# Patient Record
Sex: Male | Born: 1975 | Race: Asian | Hispanic: No | Marital: Married | Smoking: Former smoker
Health system: Southern US, Community
[De-identification: ages and names within clinical notes are randomized; demographics above are authoritative.]

## PROBLEM LIST (undated history)

## (undated) DIAGNOSIS — R55 Syncope and collapse: Secondary | ICD-10-CM

## (undated) DIAGNOSIS — Z720 Tobacco use: Secondary | ICD-10-CM

## (undated) DIAGNOSIS — L0100 Impetigo, unspecified: Secondary | ICD-10-CM

## (undated) DIAGNOSIS — D150 Benign neoplasm of thymus: Secondary | ICD-10-CM

## (undated) DIAGNOSIS — D4989 Neoplasm of unspecified behavior of other specified sites: Secondary | ICD-10-CM

## (undated) HISTORY — DX: Neoplasm of unspecified behavior of other specified sites: D49.89

## (undated) HISTORY — PX: ABDOMINAL SURGERY: SHX537

## (undated) HISTORY — DX: Benign neoplasm of thymus: D15.0

## (undated) HISTORY — DX: Impetigo, unspecified: L01.00

## (undated) HISTORY — DX: Tobacco use: Z72.0

## (undated) HISTORY — DX: Syncope and collapse: R55

---

## 2013-12-16 ENCOUNTER — Emergency Department (HOSPITAL_COMMUNITY)
Admission: EM | Admit: 2013-12-16 | Discharge: 2013-12-16 | Disposition: A | Discharge status: Home / Self Care | Payer: BC Managed Care – PPO | Attending: Emergency Medicine | Admitting: Emergency Medicine

## 2013-12-16 ENCOUNTER — Encounter (HOSPITAL_COMMUNITY): Payer: Self-pay | Admitting: Emergency Medicine

## 2013-12-16 DIAGNOSIS — R21 Rash and other nonspecific skin eruption: Secondary | ICD-10-CM | POA: Insufficient documentation

## 2013-12-16 DIAGNOSIS — Z8619 Personal history of other infectious and parasitic diseases: Secondary | ICD-10-CM | POA: Insufficient documentation

## 2013-12-16 DIAGNOSIS — F17200 Nicotine dependence, unspecified, uncomplicated: Secondary | ICD-10-CM | POA: Insufficient documentation

## 2013-12-16 MED ORDER — PREDNISONE 20 MG PO TABS: 40.0000 mg | ORAL_TABLET | Freq: Every day | ORAL | Status: DC

## 2013-12-16 MED ORDER — PREDNISONE 20 MG PO TABS
40.0000 mg | ORAL_TABLET | Freq: Once | ORAL | Status: AC
  Administered 2013-12-16: 40 mg via ORAL
  Filled 2013-12-16: qty 2

## 2013-12-16 NOTE — ED Notes (Signed)
Pt seen at Urgent care for infection of foot. Returned for re evaluation. Cellulitis- Recommendation for IV antibiotic

## 2013-12-16 NOTE — ED Provider Notes (Signed)
Medical screening examination/treatment/procedure(s) were conducted as a shared visit with non-physician practitioner(s) and myself.  I personally evaluated the patient during the encounter.  Pt with rash on right leg, dry, scaly, draining, present for a few months.  Has been growing bigger, now spread to arms, legs, torso, face, but sparing palms, soles and scalp.  Mildly pruritic, not painful. No fevers, no one in household with similar, no new soaps, detergents.  Pt has been on oral abx without any improvement.  No airway problems.  No oral lesions.  No diarrhea.   Will start on oral prednisone and refer to dermatology.  No fever.    Saddie Benders. Dorna Mai, MD 12/16/13 3612

## 2013-12-16 NOTE — Discharge Instructions (Signed)
Call [facility] [phone].[facility] [phone], or another dermatologist for further evaluation of your rash.  Call for a follow up appointment with a Family or Primary Care Provider.  Return if Symptoms worsen.   Take medication as prescribed.

## 2013-12-16 NOTE — ED Provider Notes (Signed)
CSN: [id number]     Arrival date & time 12/16/13  1605 History   First MD Initiated Contact with Patient 12/16/13 1642     Chief Complaint  Patient presents with  . Cellulitis     (Consider location/radiation/quality/duration/timing/severity/associated sxs/prior Treatment) HPI Comments: Robert Payne is a 38 y.o. Male, presenting to the Emergency Department with a chief complaint of rash.  The patient reports an approximately 4 week history of rash to left lower extremity.  He reports spread to the rest of his body for the past few weeks.  He reports a clear fluid weeping from the lower leg lesion 2 weeks ago.  He describes the discomfort as itching.  He denies fever or chills, myalgias. He denies prodrome of tingling, pain prior to the rash appearing.  He reports having chicken pox previously.  He denies new pets, outdoor exposure, lotions or detergents, recent travel, anyone in the household with similar rash.  He reports being evaluated by Urgent care on 12/11/2013 and was prescribed Cephalexin 500 mg TID and hydroxyzine on 12/11/2013.   The history is provided by the patient. No language interpreter was used.    History reviewed. No pertinent past medical history. History reviewed. No pertinent past surgical history. History reviewed. No pertinent family history. History  Substance Use Topics  . Smoking status: Light Tobacco Smoker  . Smokeless tobacco: Never Used  . Alcohol Use: No    Review of Systems  Constitutional: Negative for fever and chills.  Skin: Positive for rash and wound.  Neurological: Negative for numbness.      Allergies  Review of patient's allergies indicates no known allergies.  Home Medications  No current outpatient prescriptions on file. BP 107/72  Pulse 91  Temp(Src) 98.9 F (37.2 C) (Oral)  Resp 20  SpO2 97% Physical Exam  Nursing note and vitals reviewed. Constitutional: He is oriented to person, place, and time. He appears well-developed and  well-nourished. No distress.  HENT:  Head: Normocephalic and atraumatic.  Neck: Neck supple.  Pulmonary/Chest: Effort normal. No respiratory distress.  Musculoskeletal: Normal range of motion. He exhibits no edema.       Legs: Large errythmic lesion approximately 15 x 10 cm to right lower extremity, non circumferential.  Scant serosanguineous dried discharge.  Multiple ulcerative vesicles in groups to bilateral lower extremities, upper extremities, trunk and face.  Nothing on the palms or webs of fingers.  Neurological: He is oriented to person, place, and time.  Skin: Skin is warm and dry. Rash noted. He is not diaphoretic.  Psychiatric: He has a normal mood and affect. His behavior is normal.    ED Course  Procedures (including critical care time) Labs Review Labs Reviewed - No data to display Imaging Review No results found.   EKG Interpretation None      MDM   Final diagnoses:  Rash   Pt with pruritic rash for several months, sent by UC. The left lower leg lesion does not appear to be cellulitic. Questionable contact dermatitis.  Wound culture shows No growth in 48 hours. Dr. Dorna Mai also evaluated the patient and agrees no signs of infection at this time.  Will refer to dermatology for further evaluation, and short course of prednisone for inflammation.  Discussed and treatment plan with the patient. Return precautions given. Reports understanding and no other concerns at this time.  Patient is stable for discharge at this time.  Meds given in ED:  Medications  predniSONE (DELTASONE) tablet 40 mg (40 mg  Oral Given 12/16/13 1836)    Discharge Medication List as of 12/16/2013  6:15 PM        Ander Purpura Burnetta Sabin, PA-C 12/18/13 1549

## 2013-12-16 NOTE — ED Notes (Signed)
Pt sts he went to [facility] and was treated for cellulitis on 4/2 and given Rx for hydroxyzine 25mg  and clindamycin 300mg . Pt sts his swelling and reddness on his R lower leg has not improved and is painful. Pt has area outlined from urgent care and infection area has not spread. Pt is concerned his treatment is not working and was referred to ED by [facility] urgent care. Pt does have results from wound culture which are negative.

## 2014-01-15 ENCOUNTER — Emergency Department (HOSPITAL_COMMUNITY)
Admission: EM | Admit: 2014-01-15 | Discharge: 2014-01-16 | Disposition: A | Discharge status: Other Acute Inpatient Hospital | Payer: BC Managed Care – PPO | Attending: Emergency Medicine | Admitting: Emergency Medicine

## 2014-01-15 ENCOUNTER — Emergency Department (HOSPITAL_COMMUNITY): Payer: BC Managed Care – PPO

## 2014-01-15 ENCOUNTER — Encounter (HOSPITAL_COMMUNITY): Payer: Self-pay | Admitting: Emergency Medicine

## 2014-01-15 DIAGNOSIS — R509 Fever, unspecified: Secondary | ICD-10-CM | POA: Insufficient documentation

## 2014-01-15 DIAGNOSIS — R000 Tachycardia, unspecified: Secondary | ICD-10-CM | POA: Insufficient documentation

## 2014-01-15 DIAGNOSIS — R222 Localized swelling, mass and lump, trunk: Secondary | ICD-10-CM | POA: Insufficient documentation

## 2014-01-15 DIAGNOSIS — R21 Rash and other nonspecific skin eruption: Secondary | ICD-10-CM | POA: Insufficient documentation

## 2014-01-15 DIAGNOSIS — J9859 Other diseases of mediastinum, not elsewhere classified: Secondary | ICD-10-CM

## 2014-01-15 DIAGNOSIS — F17200 Nicotine dependence, unspecified, uncomplicated: Secondary | ICD-10-CM | POA: Insufficient documentation

## 2014-01-15 LAB — URINALYSIS, ROUTINE W REFLEX MICROSCOPIC
Bilirubin Urine: NEGATIVE
Glucose, UA: NEGATIVE mg/dL
Hgb urine dipstick: NEGATIVE
Ketones, ur: NEGATIVE mg/dL
LEUKOCYTES UA: NEGATIVE
Nitrite: NEGATIVE
Protein, ur: NEGATIVE mg/dL
Specific Gravity, Urine: 1.022 (ref 1.005–1.030)
UROBILINOGEN UA: 0.2 mg/dL (ref 0.0–1.0)
pH: 6 (ref 5.0–8.0)

## 2014-01-15 LAB — CBC WITH DIFFERENTIAL/PLATELET
BASOS ABS: 0 10*3/uL (ref 0.0–0.1)
Basophils Relative: 0 % (ref 0–1)
EOS ABS: 0.1 10*3/uL (ref 0.0–0.7)
EOS PCT: 1 % (ref 0–5)
HEMATOCRIT: 46.4 % (ref 39.0–52.0)
Hemoglobin: 15.2 g/dL (ref 13.0–17.0)
LYMPHS PCT: 8 % — AB (ref 12–46)
Lymphs Abs: 0.7 10*3/uL (ref 0.7–4.0)
MCH: 30.2 pg (ref 26.0–34.0)
MCHC: 32.8 g/dL (ref 30.0–36.0)
MCV: 92.1 fL (ref 78.0–100.0)
Monocytes Absolute: 0.4 10*3/uL (ref 0.1–1.0)
Monocytes Relative: 4 % (ref 3–12)
Neutro Abs: 7.6 10*3/uL (ref 1.7–7.7)
Neutrophils Relative %: 88 % — ABNORMAL HIGH (ref 43–77)
Platelets: 197 10*3/uL (ref 150–400)
RBC: 5.04 MIL/uL (ref 4.22–5.81)
RDW: 12.2 % (ref 11.5–15.5)
WBC: 8.6 10*3/uL (ref 4.0–10.5)

## 2014-01-15 LAB — COMPREHENSIVE METABOLIC PANEL
ALT: 61 U/L — ABNORMAL HIGH (ref 0–53)
AST: 52 U/L — ABNORMAL HIGH (ref 0–37)
Albumin: 3.8 g/dL (ref 3.5–5.2)
Alkaline Phosphatase: 57 U/L (ref 39–117)
BUN: 10 mg/dL (ref 6–23)
CO2: 27 meq/L (ref 19–32)
CREATININE: 1.13 mg/dL (ref 0.50–1.35)
Calcium: 8.9 mg/dL (ref 8.4–10.5)
Chloride: 95 mEq/L — ABNORMAL LOW (ref 96–112)
GFR, EST NON AFRICAN AMERICAN: 82 mL/min — AB (ref >90)
Glucose, Bld: 116 mg/dL — ABNORMAL HIGH (ref 70–99)
Potassium: 3.8 mEq/L (ref 3.7–5.3)
Sodium: 136 mEq/L — ABNORMAL LOW (ref 137–147)
Total Bilirubin: 0.2 mg/dL — ABNORMAL LOW (ref 0.3–1.2)
Total Protein: 7.2 g/dL (ref 6.0–8.3)

## 2014-01-15 LAB — RAPID STREP SCREEN (MED CTR MEBANE ONLY): STREPTOCOCCUS, GROUP A SCREEN (DIRECT): NEGATIVE

## 2014-01-15 LAB — MONONUCLEOSIS SCREEN: Mono Screen: NEGATIVE

## 2014-01-15 MED ORDER — IOHEXOL 300 MG/ML  SOLN
100.0000 mL | Freq: Once | INTRAMUSCULAR | Status: AC | PRN
  Administered 2014-01-15: 100 mL via INTRAVENOUS

## 2014-01-15 MED ORDER — ACETAMINOPHEN 325 MG PO TABS
650.0000 mg | ORAL_TABLET | Freq: Four times a day (QID) | ORAL | Status: DC | PRN
Start: 2014-01-15 — End: 2014-01-16
  Administered 2014-01-15 (×2): 650 mg via ORAL
  Filled 2014-01-15 (×2): qty 2

## 2014-01-15 NOTE — ED Notes (Addendum)
Pt c/o fever x 2 days, states it started to spike today. Pt has been taking paracetamol for his fever. Pt also c/o slight headache, pt also had flushed red face.

## 2014-01-15 NOTE — ED Notes (Addendum)
Pt A&Ox4. Reports fevers "around 100" starting two days ago. Has been taking "medicine from [place] for fever called panadol." Patient has red raised rash on back, abdomen, and chest. Was seen a month ago and was diagnosed with skin infection but doesn't remember what he was diagnosed with. Was treated with prednisone and bactrim. However, raised rash started this morning. Noticed facial flushing this morning and felt "hot" with a headache. Drinking lots of water. Denies N, V, D, chest pain but does have extreme chills.

## 2014-01-15 NOTE — ED Provider Notes (Signed)
CSN: [id number]     Arrival date & time 01/15/14  1714 History   First MD Initiated Contact with Patient 01/15/14 2015     Chief Complaint  Patient presents with  . Fever     (Consider location/radiation/quality/duration/timing/severity/associated sxs/prior Treatment) The history is provided by the patient and medical records. No language interpreter was used.    Robert Payne is a 38 y.o. male  with no known medical history presents to the Emergency Department complaining of gradual, persistent, progressively worsening fever onset 36 hours ago.  Patient reports yesterday he had a low-grade fever and he began taking paracetamol for this.  He reports that today his fever spiked to 105 measured with an infrared thermometer.  He reports taking Tylenol prior to arrival. He also reports that after his fever became high he developed a rash over his entire body including his face.  He reports that approximately one month ago he was seen in the emergency Department for rash on his right lower leg and subsequently followed up with dermatology. He is unsure of the diagnosis however he was given Bactrim, prednisone and topical cream with some relief.  He reports the rash that he has on his body today is completely different from previous. He denies new foods, lotions, environments, recent travel or known sick contacts.  He reports he has been in the [place] for approximately 4 years.  He has had no overseas visitors. No tick bites.  Vaccines UTD.    History reviewed. No pertinent past medical history. History reviewed. No pertinent past surgical history. No family history on file. History  Substance Use Topics  . Smoking status: Light Tobacco Smoker  . Smokeless tobacco: Never Used  . Alcohol Use: No    Review of Systems  Constitutional: Positive for fever. Negative for diaphoresis, appetite change, fatigue and unexpected weight change.  HENT: Negative for mouth sores.   Eyes: Negative for  visual disturbance.  Respiratory: Negative for cough, chest tightness, shortness of breath and wheezing.   Cardiovascular: Negative for chest pain.  Gastrointestinal: Negative for nausea, vomiting, abdominal pain, diarrhea and constipation.  Endocrine: Negative for polydipsia, polyphagia and polyuria.  Genitourinary: Negative for dysuria, urgency, frequency and hematuria.  Musculoskeletal: Negative for back pain and neck stiffness.  Skin: Positive for rash.  Allergic/Immunologic: Negative for immunocompromised state.  Neurological: Negative for syncope, light-headedness and headaches.  Hematological: Does not bruise/bleed easily.  Psychiatric/Behavioral: Negative for sleep disturbance. The patient is not nervous/anxious.       Allergies  Review of patient's allergies indicates no known allergies.  Home Medications   Prior to Admission medications   Medication Sig Start Date End Date Taking? Authorizing Provider  PRESCRIPTION MEDICATION Take 500 mg by mouth as needed (fever). Paracetamol 500mg    Yes Historical Provider, MD   BP 101/64  Pulse 87  Temp(Src) 99 F (37.2 C) (Oral)  Resp 16  SpO2 96% Physical Exam  Nursing note and vitals reviewed. Constitutional: He is oriented to person, place, and time. He appears well-developed and well-nourished. No distress.  Awake, alert, nontoxic appearance  HENT:  Head: Normocephalic and atraumatic.  Right Ear: Tympanic membrane, external ear and ear canal normal.  Left Ear: Tympanic membrane, external ear and ear canal normal.  Nose: Nose normal. No epistaxis. Right sinus exhibits no maxillary sinus tenderness and no frontal sinus tenderness. Left sinus exhibits no maxillary sinus tenderness and no frontal sinus tenderness.  Mouth/Throat: Uvula is midline, oropharynx is clear and moist and  mucous membranes are normal. Mucous membranes are not pale and not cyanotic. No oropharyngeal exudate, posterior oropharyngeal edema, posterior  oropharyngeal erythema or tonsillar abscesses.  Erythematous oropharynx without tonsillar swelling, exudate or edema No oral lesions, purpura or petechiae of the palate  Eyes: Conjunctivae are normal. Pupils are equal, round, and reactive to light. No scleral icterus.  Neck: Normal range of motion and full passive range of motion without pain. Neck supple. No spinous process tenderness and no muscular tenderness present. No rigidity. Normal range of motion present. No Brudzinski's sign and no Kernig's sign noted.  Full range of motion without pain No midline or paraspinal tenderness No nuchal rigidity or meningeal signs  Cardiovascular: Regular rhythm, normal heart sounds and intact distal pulses.  Tachycardia present.   Pulses:      Radial pulses are 2+ on the right side, and 2+ on the left side.       Dorsalis pedis pulses are 2+ on the right side, and 2+ on the left side.       Posterior tibial pulses are 2+ on the right side, and 2+ on the left side.  Mild tachycardia  Pulmonary/Chest: Effort normal and breath sounds normal. No stridor. No respiratory distress. He has no wheezes.  Clear and equal breath sounds  Abdominal: Soft. Bowel sounds are normal. He exhibits no mass. There is no tenderness. There is no rebound and no guarding. Hernia confirmed negative in the right inguinal area and confirmed negative in the left inguinal area.  Abdomen soft and nontender  Genitourinary: Testes normal and penis normal. Right testis shows no mass and no tenderness. Left testis shows no mass and no tenderness. No penile erythema.  NO rash to the genitals  Musculoskeletal: Normal range of motion. He exhibits no edema.  Lymphadenopathy:    He has no cervical adenopathy.       Right: No inguinal adenopathy present.       Left: No inguinal adenopathy present.  Neurological: He is alert and oriented to person, place, and time. He exhibits normal muscle tone. Coordination normal.  Speech is clear and goal  oriented Moves extremities without ataxia  Skin: Skin is warm and dry. Rash noted. He is not diaphoretic. There is erythema.  Macular rash dispersed over the entire body and face, blanching Papular rash, vesicular and excoriated noted over the legs and hands consistent with healing MRSA Red, scaly patch noted to the right anterior leg without evidence of secondary infection No purpura or petechiae  Psychiatric: He has a normal mood and affect.    ED Course  Procedures (including critical care time) Labs Review Labs Reviewed  CBC WITH DIFFERENTIAL - Abnormal; Notable for the following:    Neutrophils Relative % 88 (*)    Lymphocytes Relative 8 (*)    All other components within normal limits  COMPREHENSIVE METABOLIC PANEL - Abnormal; Notable for the following:    Sodium 136 (*)    Chloride 95 (*)    Glucose, Bld 116 (*)    AST 52 (*)    ALT 61 (*)    Total Bilirubin 0.2 (*)    GFR calc non Af Amer 82 (*)    All other components within normal limits  RAPID STREP SCREEN  CULTURE, GROUP A STREP  URINALYSIS, ROUTINE W REFLEX MICROSCOPIC  MONONUCLEOSIS SCREEN  ROCKY MTN SPOTTED FVR AB, IGG-BLOOD  ROCKY MTN SPOTTED FVR AB, IGM-BLOOD  HIV ANTIBODY (ROUTINE TESTING)    Imaging Review Dg Chest 2 View  01/15/2014   CLINICAL DATA:  Fever, rash, smoker.  EXAM: CHEST  2 VIEW  COMPARISON:  None.  FINDINGS: No evidence of pneumonia, edema, effusion, or pneumothorax. There is a mediastinal mass, likely anterior, measuring nearly 7 cm. This could represent adenopathy, thymic tumor or germ cell tumor. Normal heart size.  IMPRESSION: 1. 7 cm anterior mediastinal mass, as above. Recommend enhanced chest CT followup. 2. Negative for pneumonia or edema.   Electronically Signed   By: Jorje Guild M.D.   On: 01/15/2014 21:28   Ct Chest W Contrast  01/15/2014   CLINICAL DATA:  Mediastinal mass  EXAM: CT CHEST WITH CONTRAST  TECHNIQUE: Multidetector CT imaging of the chest was performed during  intravenous contrast administration.  CONTRAST:  158mL OMNIPAQUE IOHEXOL 300 MG/ML  SOLN  COMPARISON:  DG CHEST 2 VIEW dated 01/15/2014  FINDINGS: The central airways are patent. The lungs are clear. There is no pleural effusion or pneumothorax.  There are no pathologically enlarged axillary, hilar or mediastinal lymph nodes. There is a right anterior mediastinal mass measuring 3.4 x 6.1 cm with both a cystic and solid component. The solid component measures 3.2 x 3 cm.  The heart size is normal. There is no pericardial effusion. The thoracic aorta is normal in caliber.  Review of bone windows demonstrates no focal lytic or sclerotic lesions.  Limited non-contrast images of the upper abdomen were obtained. The adrenal glands appear normal. The remainder of the visualized abdominal organs are unremarkable.  IMPRESSION: 1. No acute disease of the chest. 2. 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 Differential considerations include thymoma, thymic carcinoma, germ cell neoplasm versus lymphoma.   Electronically Signed   By: Kathreen Devoid   On: 01/15/2014 23:41     EKG Interpretation None                MDM   Final diagnoses:  Mediastinal mass  Fever  Rash   WALDEMAR SIEGEL presents with fever and rash.  Patient without recent travel. Denies new contacts. Patient without nuchal rigidity, digital signs, purpura petechiae. Highly doubt meningitis. Will assess for possibility of other infections including strep and mononucleosis.  Also SJS is of concern as pt has been taking Bactrim.  Exam without oral lesions,   10:16 PM Chest and mononucleosis negative. Urinalysis without evidence of urinary tract infection, CBC unremarkable. CMP with mildly elevated transaminase but otherwise reassuring. Chest x-ray with a 7 cm anterior mediastinal mass but no evidence of pneumonia, pulmonary edema or pneumothorax. No evidence of tuberculosis on chest x-ray. Will obtain CT  chest is recommended for followup of mediastinal mass.  12:10 AM CT with Complex right anterior mediastinal mass with both cystic and solid components. Differential considerations include thymoma, thymic carcinoma, germ cell neoplasm versus lymphoma.  I personally reviewed the imaging tests through PACS system.  I reviewed available ER/hospitalization records through the EMR.    Concerns for possible early SJS and pt with fevers and large mediastinal mass.  He is going to need admission for observation of rash and work-up of mass.  Pt rash is only a bit improved.  His fever has resolved at last check.     12:49 AM Discussed with [facility] who will evaluate.    1:54 AM Medicine admission for fever with diffuse body rash and chest mass at [facility]       Jarrett Soho Camarie Mctigue, PA-C 01/16/14 [phone]

## 2014-01-15 NOTE — ED Notes (Signed)
The need for urine is noted, the Pt does not have to urinate at this time. A urinal was left at the bedside with instructions to use the call bell to notify staff when he has provided a sample.

## 2014-01-15 NOTE — ED Provider Notes (Signed)
Medical screening examination/treatment/procedure(s) were conducted as a shared visit with non-physician practitioner(s) and myself.  I personally evaluated the patient during the encounter.   EKG Interpretation None     Patient with rash and fever x2 days. Rash is erythematous and macular and is on his face and chest. He has been on Bactrim for presumed MRSA infection. 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 We'll have medicine consultation  Leota Jacobsen, MD 01/15/14 2347

## 2014-01-16 LAB — ROCKY MTN SPOTTED FVR AB, IGG-BLOOD: RMSF IgG: 0.13 IV

## 2014-01-16 LAB — HIV ANTIBODY (ROUTINE TESTING W REFLEX): HIV: NONREACTIVE

## 2014-01-16 LAB — ROCKY MTN SPOTTED FVR AB, IGM-BLOOD: RMSF IgM: 0.11 IV (ref 0.00–0.89)

## 2014-01-16 NOTE — ED Notes (Signed)
Bed assignment received from [place] at [facility] at this time.

## 2014-01-16 NOTE — ED Notes (Signed)
[facility] called for transport. 

## 2014-01-16 NOTE — Progress Notes (Signed)
Requested by ED to evaluate patient for rash and incidental finding on chest mass on Ct chest, patient appears to be having 3 different type of rash. maculopapular rash in trunk extending to periphery, and plagues skin rash on extremities, arms and legs,  And diffuse scaly erythema on the face, as well presents with fever, etiology in unclear, patient with need tertiary care center evaluation with dermatology service.

## 2014-01-17 LAB — CULTURE, GROUP A STREP

## 2014-01-17 NOTE — ED Provider Notes (Signed)
Medical screening examination/treatment/procedure(s) were performed by non-physician practitioner and as supervising physician I was immediately available for consultation/collaboration.  Dewight Catino T Cannon Quinton, MD 01/17/14 1523 

## 2014-01-27 HISTORY — PX: TUBE THORACOTOMY: SHX459

## 2014-02-20 ENCOUNTER — Ambulatory Visit (INDEPENDENT_AMBULATORY_CARE_PROVIDER_SITE_OTHER): Payer: BC Managed Care – PPO | Admitting: Family Medicine

## 2014-02-20 ENCOUNTER — Encounter: Payer: Self-pay | Admitting: Family Medicine

## 2014-02-20 VITALS — BP 102/60 | HR 66 | Temp 98.2°F | Ht 66.25 in | Wt 128.0 lb

## 2014-02-20 DIAGNOSIS — R222 Localized swelling, mass and lump, trunk: Secondary | ICD-10-CM

## 2014-02-20 DIAGNOSIS — Z87898 Personal history of other specified conditions: Secondary | ICD-10-CM

## 2014-02-20 DIAGNOSIS — F17200 Nicotine dependence, unspecified, uncomplicated: Secondary | ICD-10-CM

## 2014-02-20 DIAGNOSIS — Z720 Tobacco use: Secondary | ICD-10-CM | POA: Insufficient documentation

## 2014-02-20 DIAGNOSIS — J9859 Other diseases of mediastinum, not elsewhere classified: Secondary | ICD-10-CM

## 2014-02-20 DIAGNOSIS — Z9189 Other specified personal risk factors, not elsewhere classified: Secondary | ICD-10-CM

## 2014-02-20 NOTE — Progress Notes (Signed)
Pre visit review using our clinic review tool, if applicable. No additional management support is needed unless otherwise documented below in the visit note. 

## 2014-02-20 NOTE — Progress Notes (Signed)
No chief complaint on file.   HPI:  Robert Payne is here to establish care. Moved from [place] 4 years ago. Lives with wife and young child.   Last PCP and physical:  Has the following chronic problems and concerns today:  Patient Active Problem List   Diagnosis Date Noted  . Tobacco use 02/20/2014  . 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 Seizure  -reports evaluated in ED and then told may want to see neurologist - wants referral to neurologist -no exercise since but no further episodes -denies: DOE, CP, papitaitons, any LOC since  ROS: See pertinent positives and negatives per HPI.  Past Medical History  Diagnosis Date  . Syncope     in 2013 per pt evaluated in ED and ok    No family history on file.  History   Social History  . Marital Status: Married    Spouse Name: N/A    Number of Children: N/A  . Years of Education: N/A   Social History Main Topics  . Smoking status: Former Research scientist (life sciences)  . Smokeless tobacco: Never Used     Comment: quit 1 month ago as of 02/20/2014 - light smoker in the past for 12 years  . Alcohol Use: No  . Drug Use: No   . Sexual Activity: None   Other Topics Concern  . 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  EXAM:  Filed Vitals:   02/20/14 1431  BP: 102/60  Pulse: 66  Temp: 98.2 F (36.8 C)    Body mass index is 20.5 kg/(m^2).  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 -We provided refills for any medications we will prescribe as needed. -We addressed current concerns per orders and patient instructions. -We have asked for records for pertinent exams, studies, vaccines and notes from previous providers. -We have advised patient to follow up per instructions below. -Follow up as needed and yearly  -Patient advised to return or notify a doctor immediately if symptoms worsen or persist or new concerns arise.  Patient Instructions  -We placed a referral for you as discussed to the neurologist. It usually takes about  1-2 weeks to process and schedule this referral. If you have not heard from [place] regarding this appointment in 2 weeks please contact our office.       Colin Benton R.

## 2014-02-20 NOTE — Patient Instructions (Signed)
-  We placed a referral for you as discussed to the neurologist. It usually takes about 1-2 weeks to process and schedule this referral. If you have not heard from [place] regarding this appointment in 2 weeks please contact our office.

## 2014-03-09 HISTORY — PX: OTHER SURGICAL HISTORY: SHX169

## 2014-04-10 DIAGNOSIS — D4989 Neoplasm of unspecified behavior of other specified sites: Secondary | ICD-10-CM | POA: Insufficient documentation

## 2014-04-10 DIAGNOSIS — D150 Benign neoplasm of thymus: Secondary | ICD-10-CM

## 2014-05-05 ENCOUNTER — Ambulatory Visit (INDEPENDENT_AMBULATORY_CARE_PROVIDER_SITE_OTHER): Payer: BC Managed Care – PPO | Admitting: Neurology

## 2014-05-05 ENCOUNTER — Encounter: Payer: Self-pay | Admitting: Neurology

## 2014-05-05 VITALS — BP 100/70 | HR 86 | Ht 67.0 in | Wt 132.0 lb

## 2014-05-05 DIAGNOSIS — R55 Syncope and collapse: Secondary | ICD-10-CM

## 2014-05-05 NOTE — Progress Notes (Signed)
NEUROLOGY CONSULTATION NOTE  AGUSTUS MANE MRN: [id number] DOB: 09-13-1975  Referring provider: Dr. Colin Benton Primary care provider: Dr. Colin Benton  Reason for consult:  syncope  Dear Dr Maudie Mercury:  Thank you for your kind referral of Robert Payne for consultation of the above symptoms. Although his history is well known to you, please allow me to reiterate it for the purpose of our medical record. Records and images were personally reviewed where available.  HISTORY OF PRESENT ILLNESS: This is a pleasant 38 year old right-handed man with a history of thymoma s/p resection, presenting for episodes of loss of consciousness.  The first episode occurred 8-9 years ago while living in [place]. He was working hard and recalls feeling hungry. He was outside smoking, then suddenly he felt a little lightheaded, heart was beating faster, then everything went dark and he woke up on the ground. The second episode occurred a few years later. He gets motion sickness, and had been feeling nauseated in the bus. He got off the bus and walked a little, then started feeling lightheaded and was witnessed by his wife to pass out for a minute.  No bowel/bladder incontinence, no tongue bite. He has not injured himself with these.  The last episode occurred around 2 years ago while living in [place]. He was out jogging with his wife, and ran faster for 5 minutes. He started feeling short of breath with heart beating faster, then felt lightheaded and passed out. His wife reported he had a little shaking. He woke up and vomited, and was brought to the ER where head CT reported as normal. He was advised to see a neurologist.  Since then, he denies any further similar episodes.  He has not been exercising due to his wife's fear of another similar episode.  He denies any headaches, dizziness, diplopia, dysarthria, dysphagia, neck/back pain, focal numbness/tingling/weakness, bowel/bladder dysfunction. He denies any chest pain. He  denies any olfactory/gustatory hallucinations, deja vu, rising epigastric sensation, myoclonic jerks.  He had a normal birth and early development.  There is no history of febrile convulsions, CNS infections such as meningitis/encephalitis, significant traumatic brain injury, neurosurgical procedures, or family history of seizures.  He reports having an echocardiogram done at [facility] as part of surgical clearance prior to thymoma surgery that was normal.  Thymoma was found incidentally after he had a chest xray for fever due to Bactrim allergy.  PAST MEDICAL HISTORY: Past Medical History  Diagnosis Date  . Syncope     in 2013 per pt evaluated in ED and ok    PAST SURGICAL HISTORY: Past Surgical History  Procedure Laterality Date  . Abdominal surgery      MEDICATIONS: No current outpatient prescriptions on file prior to visit.   No current facility-administered medications on file prior to visit.    ALLERGIES: Allergies  Allergen Reactions  . Sulfamethoxazole-Trimethoprim Rash    FAMILY HISTORY: No family history on file.  SOCIAL HISTORY: History   Social History  . Marital Status: Married    Spouse Name: N/A    Number of Children: N/A  . Years of Education: N/A   Occupational History  . Not on file.   Social History Main Topics  . Smoking status: Former Research scientist (life sciences)  . Smokeless tobacco: Never Used     Comment: quit 1 month ago as of 02/20/2014 - light smoker in the past for 12 years  . Alcohol Use: No  . Drug Use: No  . Sexual Activity:  Not on file   Other Topics Concern  . 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      Spiritual Beliefs: Christian      Lifestyle: no regular exercise; diet is healthy             REVIEW OF SYSTEMS: Constitutional: No fevers, chills, or sweats, no generalized fatigue, change in appetite Eyes: No visual changes, double vision, eye  pain Ear, nose and throat: No hearing loss, ear pain, nasal congestion, sore throat Cardiovascular: No chest pain, palpitations Respiratory:  No shortness of breath at rest or with exertion, wheezes GastrointestinaI: No nausea, vomiting, diarrhea, abdominal pain, fecal incontinence Genitourinary:  No dysuria, urinary retention or frequency Musculoskeletal:  No neck pain, back pain Integumentary: + rash on right leg, no pruritus Neurological: as above Psychiatric: No depression, insomnia, anxiety Endocrine: No palpitations, fatigue, diaphoresis, mood swings, change in appetite, change in weight, increased thirst Hematologic/Lymphatic:  No anemia, purpura, petechiae. Allergic/Immunologic: no itchy/runny eyes, nasal congestion, recent allergic reactions, rashes  PHYSICAL EXAM: Filed Vitals:   05/05/14 0913  BP: 100/70  Pulse: 86   General: No acute distress Head:  Normocephalic/atraumatic Eyes: Fundoscopic exam shows bilateral sharp discs, no vessel changes, exudates, or hemorrhages Neck: supple, no paraspinal tenderness, full range of motion Back: No paraspinal tenderness Heart: regular rate and rhythm Lungs: Clear to auscultation bilaterally. Vascular: No carotid bruits. Skin/Extremities: + rash on right calf, no edema Neurological Exam: Mental status: alert and oriented to person, place, and time, no dysarthria or aphasia, Fund of knowledge is appropriate.  Recent and remote memory are intact.  Attention and concentration are normal.    Able to name objects and repeat phrases. Cranial nerves: CN I: not tested CN II: pupils equal, round and reactive to light, visual fields intact, fundi unremarkable. CN III, IV, VI:  full range of motion, no nystagmus, no ptosis CN V: facial sensation intact CN VII: upper and lower face symmetric CN VIII: hearing intact to finger rub CN IX, X: gag intact, uvula midline CN XI: sternocleidomastoid and trapezius muscles intact CN XII: tongue  midline Bulk & Tone: normal, no fasciculations. Motor: 5/5 throughout with no pronator drift. Sensation: intact to light touch, cold, pin, vibration and joint position sense.  No extinction to double simultaneous stimulation.  Romberg test negative Deep Tendon Reflexes: +2 throughout, no ankle clonus Plantar responses: downgoing bilaterally Cerebellar: no incoordination on finger to nose, heel to shin. No dysdiadochokinesia Gait: narrow-based and steady, able to tandem walk adequately. Tremor: none  IMPRESSION: This is a pleasant 38 year old right-handed man with a history of thymoma s/p resection, and 3 episodes of loss of consciousness suggestive of vasovagal syncope.  His neurological exam is normal.  Carotid dopplers will be ordered and he will be referred to Cardiology for syncope evaluation.  We discussed vasovagal syncope and ruling out cardiac causes of syncope.  No further neurologic workup indicated at this time, he knows to call our office for any change in his symptoms.  [place] driving laws were discussed with the patient, and he knows to stop driving after any episode of loss of consciousness, until 6 months event-free. He will follow-up on a prn basis.  Thank you for allowing me to participate in the care of this patient. Please do not hesitate to call for any questions or concerns.  Ellouise Newer, M.D.  CC: Dr. Maudie Mercury

## 2014-05-05 NOTE — Patient Instructions (Signed)
1. Symptoms are suggestive of vasovagal syncope 2. Schedule for carotid dopplers 3. Refer to Cardiology for syncope 4. As per [place] driving laws, after an episode of loss of consciousness, one should not drive until 6 months event-free

## 2014-05-12 ENCOUNTER — Ambulatory Visit (HOSPITAL_COMMUNITY)
Admission: RE | Admit: 2014-05-12 | Discharge: 2014-05-12 | Disposition: A | Discharge status: Home / Self Care | Payer: BC Managed Care – PPO | Source: Ambulatory Visit | Attending: Neurology | Admitting: Neurology

## 2014-05-12 ENCOUNTER — Other Ambulatory Visit (HOSPITAL_COMMUNITY): Payer: Self-pay | Admitting: Family Medicine

## 2014-05-12 DIAGNOSIS — F17200 Nicotine dependence, unspecified, uncomplicated: Secondary | ICD-10-CM | POA: Diagnosis not present

## 2014-05-12 DIAGNOSIS — R55 Syncope and collapse: Secondary | ICD-10-CM

## 2014-05-12 DIAGNOSIS — I658 Occlusion and stenosis of other precerebral arteries: Secondary | ICD-10-CM | POA: Diagnosis not present

## 2014-05-12 DIAGNOSIS — Z720 Tobacco use: Secondary | ICD-10-CM

## 2014-05-12 DIAGNOSIS — I6529 Occlusion and stenosis of unspecified carotid artery: Secondary | ICD-10-CM | POA: Insufficient documentation

## 2014-05-12 NOTE — Progress Notes (Signed)
Bilateral carotid artery duplex completed:  1-39% ICA stenosis.  Vertebral artery flow is antegrade.     

## 2014-06-05 ENCOUNTER — Encounter: Payer: Self-pay | Admitting: *Deleted

## 2014-06-09 ENCOUNTER — Encounter: Payer: Self-pay | Admitting: Cardiology

## 2014-06-09 ENCOUNTER — Ambulatory Visit (INDEPENDENT_AMBULATORY_CARE_PROVIDER_SITE_OTHER): Payer: BC Managed Care – PPO | Admitting: Cardiology

## 2014-06-09 VITALS — BP 112/68 | HR 82 | Ht 67.0 in | Wt 132.0 lb

## 2014-06-09 DIAGNOSIS — D4989 Neoplasm of unspecified behavior of other specified sites: Secondary | ICD-10-CM

## 2014-06-09 DIAGNOSIS — R0609 Other forms of dyspnea: Secondary | ICD-10-CM

## 2014-06-09 DIAGNOSIS — R55 Syncope and collapse: Secondary | ICD-10-CM

## 2014-06-09 DIAGNOSIS — D150 Benign neoplasm of thymus: Secondary | ICD-10-CM

## 2014-06-09 DIAGNOSIS — R0989 Other specified symptoms and signs involving the circulatory and respiratory systems: Secondary | ICD-10-CM

## 2014-06-09 LAB — LIPID PANEL
Cholesterol: 197 mg/dL (ref 0–200)
HDL: 52.8 mg/dL (ref >39.00)
LDL Cholesterol: 106 mg/dL — ABNORMAL HIGH (ref 0–99)
NonHDL: 144.2
Total CHOL/HDL Ratio: 4
Triglycerides: 193 mg/dL — ABNORMAL HIGH (ref 0.0–149.0)
VLDL: 38.6 mg/dL (ref 0.0–40.0)

## 2014-06-09 LAB — TSH: TSH: 0.31 u[IU]/mL — ABNORMAL LOW (ref 0.35–4.50)

## 2014-06-09 NOTE — Progress Notes (Signed)
Patient ID: HOLLIE BARTUS, male   DOB: 01-06-1976, 38 y.o.   MRN: [id number]     Patient Name: Robert Payne Date of Encounter: 06/09/2014  Primary Care Provider:  Lucretia Kern., DO Primary Cardiologist:  Dorothy Spark  Problem List   Past Medical History  Diagnosis Date  . Syncope     in 2013 per pt evaluated in ED and ok  . Impetigo     treated with Bactrim resulting in systemic drug rash  . Thymoma   . Mediastinal mass    Past Surgical History  Procedure Laterality Date  . Abdominal surgery    . Tube thoracotomy  01/27/14  . Robatic thymectomy  03/09/14    Allergies  Allergies  Allergen Reactions  . Sulfamethoxazole-Trimethoprim Rash    HPI  HISTORY OF PRESENT ILLNESS: This is a pleasant 38 year old man with a history of thymoma s/p resection in June 2015 at the [facility] , presenting for episodes of loss of consciousness.  The first episode occurred 8-9 years ago while living in [place]. He was working hard and recalls feeling hungry. He was outside smoking, then suddenly he felt a little lightheaded, heart was beating faster, then everything went dark and he woke up on the ground. The second episode occurred a few years later. He gets motion sickness, and had been feeling nauseated in the bus. He got off the bus and walked a little, then started feeling lightheaded and was witnessed by his wife to pass out for a minute.  No bowel/bladder incontinence, no tongue bite. He has not injured himself with these.  The last episode occurred around 2 years ago while living in [place]. He was out jogging with his wife, and ran faster for 5 minutes. He started feeling short of breath with heart beating faster, then felt lightheaded and passed out. His wife reported he had a little shaking. He woke up and vomited, and was brought to the ER where head CT reported as normal. He was advised to see a neurologist.  Since then, he denies any further similar episodes.  He has not been  exercising due to his wife's fear of another similar episode.  He denies any headaches, dizziness, diplopia, dysarthria, dysphagia, neck/back pain, focal numbness/tingling/weakness, bowel/bladder dysfunction. He denies any chest pain.  There is no family history of heart disease or SCD. His parents are both healthy, grandmother is 70 and healthy. He denies any palpitations other then during the episode when he ran, shortly before he passed out. He had an echocardiogram performed at [facility] prior to thymoma resection and it was described as normal, the only abnormality was trace TR.    Home Medications  Prior to Admission medications   Medication Sig Start Date End Date Taking? Authorizing Provider  clobetasol cream (TEMOVATE) 0.05 % as needed. 05/19/14  Yes Historical Provider, MD  triamcinolone cream (KENALOG) 0.1 % Apply topically.   Yes Historical Provider, MD    Family History  Family History  Problem Relation Age of Onset  . Anemia Neg Hx   . Arrhythmia Neg Hx   . Asthma Neg Hx   . Clotting disorder Neg Hx   . Fainting Neg Hx   . Heart attack Neg Hx   . Heart disease Neg Hx   . Heart failure Neg Hx   . Hyperlipidemia Neg Hx   . Hypertension Neg Hx     Social History  History   Social History  .  Marital Status: Married    Spouse Name: N/A    Number of Children: 1  . Years of Education: N/A   Occupational History  .     Social History Main Topics  . Smoking status: Former Research scientist (life sciences)  . Smokeless tobacco: Never Used     Comment: quit May 2015 - light smoker in the past for 12 years  . Alcohol Use: No  . Drug Use: No  . Sexual Activity: Not on file   Other Topics Concern  . 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  Cardiovascular:  No chest pain, dyspnea on exertion, edema, orthopnea, palpitations, paroxysmal nocturnal dyspnea. Dermatological: No rash, lesions/masses Respiratory: No cough, dyspnea Urologic: No hematuria, dysuria Abdominal:   No nausea, vomiting, diarrhea, bright red blood per rectum, melena, or hematemesis Neurologic:  No visual changes, wkns, changes in mental status. All other systems reviewed and are otherwise negative except as noted above.  Physical Exam  Blood pressure 112/68, pulse 82, height 5\' 7"  (1.702 m), weight 132 lb (59.875 kg).  General: Pleasant, NAD Psych: Normal affect. Neuro: Alert and oriented X 3. Moves all extremities spontaneously. HEENT: Normal  Neck: Supple without bruits or JVD. Lungs:  Resp regular and unlabored, CTA. Heart: RRR no s3, s4, 2/6 systolic murmur. Abdomen: Soft, non-tender, non-distended, BS + x 4.  Extremities: No clubbing, cyanosis or edema. DP/PT/Radials 2+ and equal bilaterally.  Labs:  No results found for this basename: CKTOTAL, CKMB, TROPONINI,  in the last 72 hours Lab Results  Component Value Date   WBC 8.6 01/15/2014   HGB 15.2 01/15/2014   HCT 46.4 01/15/2014   MCV 92.1 01/15/2014   PLT 197 01/15/2014    No results found for this basename: DDIMER   No components found with this basename: POCBNP,     Component Value Date/Time   NA 136* 01/15/2014 2105   K 3.8 01/15/2014 2105   CL 95* 01/15/2014 2105   CO2 27 01/15/2014 2105   GLUCOSE 116* 01/15/2014 2105   BUN 10 01/15/2014 2105   CREATININE 1.13 01/15/2014 2105   CALCIUM 8.9 01/15/2014 2105   PROT 7.2 01/15/2014 2105   ALBUMIN 3.8 01/15/2014 2105   AST 52* 01/15/2014 2105   ALT 61* 01/15/2014 2105   ALKPHOS 57 01/15/2014 2105   BILITOT 0.2* 01/15/2014 2105   GFRNONAA 82* 01/15/2014 2105   GFRAA >90 01/15/2014 2105   No results found for this basename: CHOL, HDL, LDLCALC, TRIG    Accessory Clinical Findings  ECG - SR,  possible bi-atrial enlargement    Assessment & Plan  38 year old male with 3 prior syncopal episodes that happened at the time of exhaustion or exercise induced tachycardia.  His echocardiogram is normal, baseline ECG is not suggestive of WPW syndrome or Brugada. NO FH of SCD. No syncope in the last 3 years. Echocardiogram at [facility] completely normal, however systolic murmur. If any abnormality found on the GXT we will repeat. We will order an exercise treadmill stress test to evaluate for exercise tolerance, HR response to  exercise and possible stress induced arrhythmias.   Labs all normal (TSH not tested).  Follow up in 1 month.  Dorothy Spark, MD, [facility] 06/09/2014, 9:12 AM

## 2014-06-09 NOTE — Patient Instructions (Signed)
Your physician recommends that you continue on your current medications as directed. Please refer to the Current Medication list given to you today.   Your physician has requested that you have an exercise tolerance test. For further information please visit [email or web address]. Please also follow instruction sheet, as given.   Your physician recommends that you return for lab work in: TODAY (TSH AND LIPIDS)    Your physician recommends that you schedule a follow-up appointment in: [place]

## 2014-07-03 ENCOUNTER — Telehealth (HOSPITAL_COMMUNITY): Payer: Self-pay

## 2014-07-03 NOTE — Telephone Encounter (Signed)
Encounter complete. 

## 2014-07-07 ENCOUNTER — Encounter (HOSPITAL_COMMUNITY): Payer: BC Managed Care – PPO

## 2014-07-13 ENCOUNTER — Ambulatory Visit: Payer: BC Managed Care – PPO | Admitting: Cardiology

## 2014-07-28 ENCOUNTER — Telehealth (HOSPITAL_COMMUNITY): Payer: Self-pay

## 2014-07-28 NOTE — Telephone Encounter (Signed)
Encounter complete. 

## 2014-07-30 ENCOUNTER — Ambulatory Visit (HOSPITAL_COMMUNITY)
Admission: RE | Admit: 2014-07-30 | Discharge: 2014-07-30 | Disposition: A | Discharge status: Home / Self Care | Payer: BC Managed Care – PPO | Source: Ambulatory Visit | Attending: Cardiology | Admitting: Cardiology

## 2014-07-30 DIAGNOSIS — D150 Benign neoplasm of thymus: Secondary | ICD-10-CM

## 2014-07-30 DIAGNOSIS — R55 Syncope and collapse: Secondary | ICD-10-CM | POA: Diagnosis present

## 2014-07-30 DIAGNOSIS — D4989 Neoplasm of unspecified behavior of other specified sites: Secondary | ICD-10-CM

## 2014-07-30 DIAGNOSIS — R0609 Other forms of dyspnea: Secondary | ICD-10-CM

## 2014-07-30 DIAGNOSIS — R0602 Shortness of breath: Secondary | ICD-10-CM

## 2014-07-30 NOTE — Procedures (Signed)
Exercise Treadmill Test Test  Exercise Tolerance Test Ordering MD: Ena Dawley, MD  Interpreting MD:   Unique Test No:1  Treadmill:  1  Indication for ETT: Palpitations, Syncope-Evaluate for Ischemia  Contraindication to ETT: No   Stress Modality: exercise - treadmill  Cardiac Imaging Performed: non   Protocol: standard Bruce - maximal  Max BP:  132/69  Max MPHR (bpm):  183 85% MPR (bpm):  156  MPHR obtained (bpm): 171 % MPHR obtained: 93  Reached 85% MPHR (min:sec):  9:40 Total Exercise Time (min-sec):  11:00  Workload in METS:  13.40 Borg Scale:   Reason ETT Terminated:  fatigue    ST Segment Analysis At Rest: NSR with diffuse ST elevation suggestive of early repolarization abnormality. With Exercise: no evidence of significant ST depression  Other Information Arrhythmia:  No Angina during ETT:  absent (0) Quality of ETT:  diagnostic  ETT Interpretation:  abnormal - no evidence of ischemia by ST analysis; hypotensive with peak exercise.  Comments: ETT with good exercise tolerance (11:00); baseline ECG with diffuse ST elevation most likely related to early repolarization abnormality; no chest pain; no arrhythmias; hypotensive response (SBP 123-stage 3; 91-stage 4; 132 one minute into recovery); no ST changes. ETT felt to be abnormal due to documented decrease BP from stage 3 to stage 4.  Robert Payne

## 2014-07-31 ENCOUNTER — Telehealth: Payer: Self-pay | Admitting: *Deleted

## 2014-07-31 ENCOUNTER — Encounter: Payer: Self-pay | Admitting: Cardiology

## 2014-07-31 ENCOUNTER — Ambulatory Visit (INDEPENDENT_AMBULATORY_CARE_PROVIDER_SITE_OTHER): Payer: BC Managed Care – PPO | Admitting: Cardiology

## 2014-07-31 VITALS — BP 120/62 | HR 69 | Ht 67.0 in | Wt 138.0 lb

## 2014-07-31 DIAGNOSIS — R9439 Abnormal result of other cardiovascular function study: Secondary | ICD-10-CM

## 2014-07-31 DIAGNOSIS — R55 Syncope and collapse: Secondary | ICD-10-CM

## 2014-07-31 DIAGNOSIS — E785 Hyperlipidemia, unspecified: Secondary | ICD-10-CM

## 2014-07-31 NOTE — Telephone Encounter (Signed)
Pt aware of results by Dr Meda Coffee and coronary CT ordered and sent to scheduling and pre cert for processing.  Pt agreed with this plan.

## 2014-07-31 NOTE — Patient Instructions (Signed)
Your physician recommends that you continue on your current medications as directed. Please refer to the Current Medication list given to you today.   DR NELSON HAS ORDERED FOR YOU TO HAVE A CORONARY CT DONE AT [place] WITH HER ON August 11, 2014 IN THE AFTERNOON.    Your physician recommends that you schedule a follow-up appointment in: WITH DR Meda Coffee AFTER YOUR CORONARY CT IS DONE

## 2014-07-31 NOTE — Telephone Encounter (Signed)
-----   Message from Dorothy Spark, MD sent at 07/31/2014  8:27 AM EST ----- The patient had abnormal stress test, he needs to be scheduled for a coronary CT to evaluate for CAD and possible anomalous origin of coronary arteries.

## 2014-07-31 NOTE — Progress Notes (Signed)
Patient ID: Robert Payne, male   DOB: [birth year], 38 y.o.   MRN: [id number]    Patient Name: Robert Payne Date of Encounter: 07/31/2014  Primary Care Provider:  Lucretia Kern., DO Primary Cardiologist:  Dorothy Spark  Problem List   Past Medical History  Diagnosis Date  . Syncope     in 2013 per pt evaluated in ED and ok  . Impetigo     treated with Bactrim resulting in systemic drug rash  . Thymoma   . Mediastinal mass    Past Surgical History  Procedure Laterality Date  . Abdominal surgery    . Tube thoracotomy  01/27/14  . Robatic thymectomy  03/09/14    Allergies  Allergies  Allergen Reactions  . 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  The first episode occurred 8-9 years ago while living in [place]. He was working hard and recalls feeling hungry. He was outside smoking, then suddenly he felt a little lightheaded, heart was beating faster, then everything went dark and he woke up on the ground. The second episode occurred a few years later. He gets motion sickness, and had been feeling nauseated in the bus. He got off the bus and walked a little, then started feeling lightheaded and was witnessed by his wife to pass out for a minute.  No bowel/bladder incontinence, no tongue bite. He has not injured himself with these.  The last episode occurred around 2 years ago while living in [place]. He was out jogging with his wife, and ran faster for 5 minutes. He started feeling short of breath with heart beating faster, then felt lightheaded and passed out. His wife reported he had a little shaking. He woke up and vomited, and was brought to the ER where head CT reported as normal. He was advised to see a neurologist.  Since then, he denies any further similar episodes.  He has not been  exercising due to his wife's fear of another similar episode.  He denies any headaches, dizziness, diplopia, dysarthria, dysphagia, neck/back pain, focal numbness/tingling/weakness, bowel/bladder dysfunction. He denies any chest pain.  There is no family history of heart disease or SCD. His parents are both healthy, grandmother is 70 and healthy. He denies any palpitations other then during the episode when he ran, shortly before he passed out. He had an echocardiogram performed at [facility] prior to thymoma resection and it was described as normal, the only abnormality was trace TR.    Home Medications  Prior to Admission medications   Medication Sig Start Date End Date Taking? Authorizing Provider  clobetasol cream (TEMOVATE) 0.05 % as needed. 05/19/14  Yes Historical Provider, MD  triamcinolone cream (KENALOG) 0.1 % Apply topically.   Yes Historical Provider, MD    Family History  Family History  Problem Relation Age of Onset  . Anemia Neg Hx   . Arrhythmia Neg Hx   . Asthma Neg Hx   . Clotting disorder Neg Hx   . Fainting Neg Hx   . Heart attack Neg Hx   . Heart disease Neg Hx   . Heart failure Neg Hx   . Hyperlipidemia Neg Hx   . Hypertension Neg Hx     Social History  History   Social History  . Marital  Status: Married    Spouse Name: N/A    Number of Children: 1  . Years of Education: N/A   Occupational History  .     Social History Main Topics  . Smoking status: Former Research scientist (life sciences)  . Smokeless tobacco: Never Used     Comment: quit May 2015 - light smoker in the past for 12 years  . Alcohol Use: No  . Drug Use: No  . Sexual Activity: Not on file   Other Topics Concern  . 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  Cardiovascular:  No chest pain, dyspnea on exertion, edema, orthopnea, palpitations, paroxysmal nocturnal dyspnea. Dermatological: No rash, lesions/masses Respiratory: No cough, dyspnea Urologic: No hematuria, dysuria Abdominal:   No nausea, vomiting, diarrhea, bright red blood per rectum, melena, or hematemesis Neurologic:  No visual changes, wkns, changes in mental status. All other systems reviewed and are otherwise negative except as noted above.  Physical Exam  Blood pressure 120/62, pulse 69, height 5\' 7"  (1.702 m), weight 138 lb (62.596 kg), SpO2 98 %.  General: Pleasant, NAD Psych: Normal affect. Neuro: Alert and oriented X 3. Moves all extremities spontaneously. HEENT: Normal  Neck: Supple without bruits or JVD. Lungs:  Resp regular and unlabored, CTA. Heart: RRR no s3, s4, 2/6 systolic murmur. Abdomen: Soft, non-tender, non-distended, BS + x 4.  Extremities: No clubbing, cyanosis or edema. DP/PT/Radials 2+ and equal bilaterally.  Labs:  No results for input(s): CKTOTAL, CKMB, TROPONINI in the last 72 hours. Lab Results  Component Value Date   WBC 8.6 01/15/2014   HGB 15.2 01/15/2014   HCT 46.4 01/15/2014   MCV 92.1 01/15/2014   PLT 197 01/15/2014    No results found for: DDIMER Invalid input(s): POCBNP    Component Value Date/Time   NA 136* 01/15/2014 2105   K 3.8 01/15/2014 2105   CL 95* 01/15/2014 2105   CO2 27 01/15/2014 2105   GLUCOSE 116* 01/15/2014 2105   BUN 10 01/15/2014 2105   CREATININE 1.13 01/15/2014 2105   CALCIUM 8.9 01/15/2014 2105   PROT 7.2 01/15/2014 2105   ALBUMIN 3.8 01/15/2014 2105   AST 52* 01/15/2014 2105   ALT 61* 01/15/2014 2105   ALKPHOS 57 01/15/2014 2105   BILITOT 0.2* 01/15/2014 2105   GFRNONAA 82* 01/15/2014 2105   GFRAA >90 01/15/2014 2105   Lab Results  Component Value Date   CHOL 197 06/09/2014    Accessory Clinical Findings  ECG - SR, possible  bi-atrial enlargement  GXT: 07/30/2014 ST Segment Analysis At Rest:NSR with diffuse ST elevation suggestive of early repolarization abnormality. With Exercise:no evidence of significant ST depression  Other Information Arrhythmia: NoAngina during ETT: absent (0) Quality of ETT: diagnostic  ETT Interpretation: abnormal - no evidence of ischemia by ST analysis; hypotensive with peak exercise.  Comments: ETT with good exercise tolerance (11:00); baseline ECG with diffuse ST elevation most likely related to early repolarization abnormality; no chest pain; no arrhythmias; hypotensive response (SBP 123-stage 3; 91-stage 4; 132 one minute into recovery); no ST changes. ETT felt  to be abnormal due to documented decrease BP from stage 3 to stage 4.    Assessment & Plan  38 year old male with   1. 3 prior syncopal episodes that happened at the time of exhaustion or exercise induced tachycardia.  His echocardiogram is normal, baseline ECG is not suggestive of WPW syndrome or Brugada. NO FH of SCD. No syncope in the last 3 years. Echocardiogram at [facility] completely normal, however systolic murmur.   Exercise treadmill stress test was abnormal - it showed baseline diffuse ST elevation most likely related to early repolarization. Patient developed hypertensive response at stage III of exercise and test was terminated. He felt profoundly fatigued and test was considered abnormal secondary to exertional hypertension.  Because this is similar to his syncopal episodes we will proceed with coronary CT to evaluate for possible stenosis and considering his age to evaluate for congenital anomalous coronary artery origin.  2. Hyperlipidemia - we will start statin based on results of coronary CT  Follow up in 1 month.  Dorothy Spark, MD, [facility] 07/31/2014, 3:51 PM

## 2014-08-03 ENCOUNTER — Encounter: Payer: Self-pay | Admitting: Cardiology

## 2014-08-12 ENCOUNTER — Ambulatory Visit (HOSPITAL_COMMUNITY)
Admission: RE | Admit: 2014-08-12 | Discharge: 2014-08-12 | Disposition: A | Discharge status: Home / Self Care | Payer: BC Managed Care – PPO | Source: Ambulatory Visit | Attending: Cardiology | Admitting: Cardiology

## 2014-08-12 DIAGNOSIS — R55 Syncope and collapse: Secondary | ICD-10-CM | POA: Diagnosis not present

## 2014-08-12 DIAGNOSIS — R9439 Abnormal result of other cardiovascular function study: Secondary | ICD-10-CM | POA: Insufficient documentation

## 2014-08-12 IMAGING — CT CT HEART MORP W/ CTA COR W/ SCORE W/ CA W/CM &/OR W/O CM
1 of 10 series · 1 of 20 positions shown, 2 images · non-contrast
Comparison: Chest CT 01/15/2014.

CLINICAL DATA: 38 year old male with recurrent exertional syncope
and abnormal stress test (hypotension at stage 3 of the treadmill
test).

EXAM:
Cardiac/Coronary  CT
TECHNIQUE: The patient was scanned on a Philips 256 scanner.

[Series 300: locator · axial · 0.35mm/px · z∈[-177,-177]mm · 1 of 1 slices shown, 2 images]
[im 1/1  vessel]
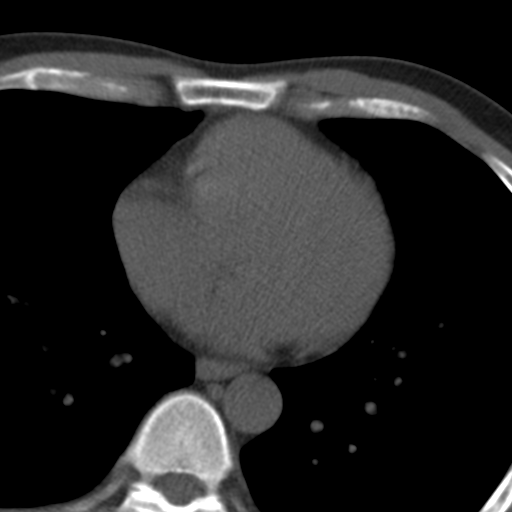
[im 1/1  lung]
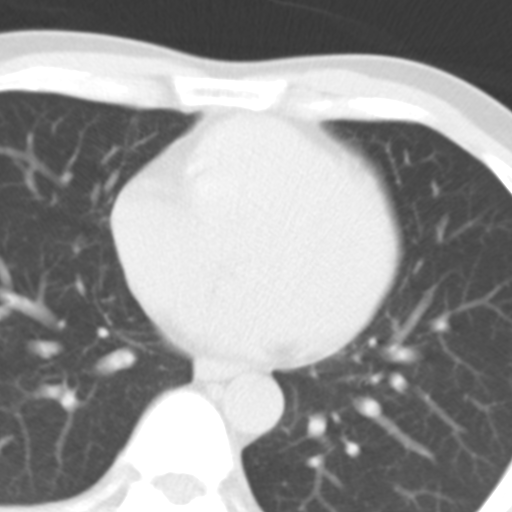

[1 of 20 positions shown; findings below may reference images not displayed]

FINDINGS: A 120 kV prospective scan was triggered in the descending thoracic
aorta at 111 HU's. Axial non-contrast 3 mm slices were carried out
through the heart. The data set was analyzed on a dedicated work
station and scored using the Agatson method. Gantry rotation speed
was 270 msecs and collimation was .9 mm. 5 mg of iv Metoprolol and
0.4 mg of sl NTG was given. The 3D data set was reconstructed in 5%
intervals of the 67-82 % of the R-R cycle. Diastolic phases were
analyzed on a dedicated work station using MPR, MIP and VRT modes.
The patient received 80 cc of contrast.

Aorta: Normal size of the aortic root and ascending thoracic aorta.
No dissection, no calcifications.

Aortic Valve:  Trileaflet.

Coronary Arteries: Normal origin of the coronary arteries. Right
dominance. No plaque.

Left main is vaey large, no plaque.

LAD is a large caliber vessel that gives rise to 1 septal perforator
and 2 diagonal branches. LAD wraps around the apex and continues in
the inferior interventricular sulcus. There is no plaque in LAD
territory.

Left circumflex artery is moderate caliber non-dominant vessel that
gives rise to 2 obtuse marginal branches. There is no plaque.

RCA is a large caliber dominant vessel that gives rise to acute
marginal, PDA and a large PLVB. There is no plaque.

Other findings: Normal origin of pulmonary veins (2 on the right and
2 on the left). There is a large left atrial appendage with no
filling defect.
IMPRESSION: 1. Coronary calcium score of 0 this was 0 percentile for age and sex
matched control.

2. Normal origin of the coronary arteries. Right dominance. No
evidence of CAD. No bridge.

Jamesha Wald

EXAM:
OVER-READ INTERPRETATION  CT CHEST

The following report is an over-read performed by radiologist Dr.
over-read does not include interpretation of cardiac or coronary
anatomy or pathology. The coronary calcium score/coronary CTA
interpretation by the cardiologist is attached.
FINDINGS: Within the visualized portions of the thorax there are no suspicious
appearing pulmonary nodules or masses, there is no acute
consolidative airspace disease, no pneumothorax and no pleural
effusions. Small subpleural bulla in the left apex is incidentally
noted. Compared to prior CT scan 01/15/2014, the previously noted
anterior mediastinal mass is no longer noted, compatible with
reported clinical history of resection of thymoma. No mediastinal or
hilar lymphadenopathy in the visualized thorax. Esophagus is
unremarkable in appearance. Visualized portions of the upper abdomen
are unremarkable. There are no aggressive appearing lytic or blastic
lesions noted in the visualized portions of the skeleton.
IMPRESSION: 1. No significant incidental noncardiac findings noted.
2. Status post resection of previously noted thymoma. No signs of
residual disease within the visualized thorax.

## 2014-08-12 MED ORDER — NITROGLYCERIN 0.4 MG SL SUBL
SUBLINGUAL_TABLET | SUBLINGUAL | Status: AC
  Filled 2014-08-12: qty 1

## 2014-08-12 MED ORDER — METOPROLOL TARTRATE 1 MG/ML IV SOLN
INTRAVENOUS | Status: AC
  Administered 2014-08-12: 5 mg
  Filled 2014-08-12: qty 10

## 2014-08-12 MED ORDER — NITROGLYCERIN 0.4 MG SL SUBL
SUBLINGUAL_TABLET | SUBLINGUAL | Status: AC
  Administered 2014-08-12: 0.4 mg
  Filled 2014-08-12: qty 1

## 2014-08-12 MED ORDER — IOHEXOL 350 MG/ML SOLN
80.0000 mL | Freq: Once | INTRAVENOUS | Status: AC | PRN
  Administered 2014-08-12: 80 mL via INTRAVENOUS

## 2014-08-24 ENCOUNTER — Ambulatory Visit (INDEPENDENT_AMBULATORY_CARE_PROVIDER_SITE_OTHER): Payer: BC Managed Care – PPO | Admitting: Cardiology

## 2014-08-24 ENCOUNTER — Encounter: Payer: Self-pay | Admitting: Cardiology

## 2014-08-24 VITALS — BP 110/66 | HR 68 | Ht 67.0 in | Wt 136.0 lb

## 2014-08-24 DIAGNOSIS — R9439 Abnormal result of other cardiovascular function study: Secondary | ICD-10-CM

## 2014-08-24 DIAGNOSIS — R55 Syncope and collapse: Secondary | ICD-10-CM

## 2014-08-24 DIAGNOSIS — I959 Hypotension, unspecified: Secondary | ICD-10-CM

## 2014-08-24 NOTE — Progress Notes (Signed)
Patient ID: Robert Payne, male   DOB: 1975/09/25, 38 y.o.   MRN: [id number]    Patient Name: Robert Payne Date of Encounter: 08/24/2014  Primary Care Provider:  Lucretia Kern., DO Primary Cardiologist:  Dorothy Spark  Problem List   Past Medical History  Diagnosis Date  . Syncope     in 2013 per pt evaluated in ED and ok  . Impetigo     treated with Bactrim resulting in systemic drug rash  . Thymoma   . Mediastinal mass    Past Surgical History  Procedure Laterality Date  . Abdominal surgery    . Tube thoracotomy  01/27/14  . Robatic thymectomy  03/09/14    Allergies  Allergies  Allergen Reactions  . 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  The first episode occurred 8-9 years ago while living in [place]. He was working hard and recalls feeling hungry. He was outside smoking, then suddenly he felt a little lightheaded, heart was beating faster, then everything went dark and he woke up on the ground. The second episode occurred a few years later. He gets motion sickness, and had been feeling nauseated in the bus. He got off the bus and walked a little, then started feeling lightheaded and was witnessed by his wife to pass out for a minute.  No bowel/bladder incontinence, no tongue bite. He has not injured himself with these.  The last episode occurred around 2 years ago while living in [place]. He was out jogging with his wife, and ran faster for 5 minutes. He started feeling short of breath with heart beating faster, then felt lightheaded and passed out. His wife reported he had a little shaking. He woke up and vomited, and was brought to the ER where head CT reported as normal. He was advised to see a neurologist.  Since then, he denies any further similar episodes.  He has not been  exercising due to his wife's fear of another similar episode.  He denies any headaches, dizziness, diplopia, dysarthria, dysphagia, neck/back pain, focal numbness/tingling/weakness, bowel/bladder dysfunction. He denies any chest pain.  There is no family history of heart disease or SCD. His parents are both healthy, grandmother is 37 and healthy. He denies any palpitations other then during the episode when he ran, shortly before he passed out. He had an echocardiogram performed at [facility] prior to thymoma resection and it was described as normal, the only abnormality was trace TR.    August 24 2014 -  Patient is coming to discuss further treatment plan. He underwent coronary CT that showed that he has cousins gross 0 in no plaque at all. He is normal coronary artery origin. He states that he had no further syncopal episodes in the last 2 years. He's otherwise asymptomatic. Home Medications  Prior to Admission medications   Medication Sig Start Date End Date Taking? Authorizing Provider  clobetasol cream (TEMOVATE) 0.05 % as needed. 05/19/14  Yes Historical Provider, MD  triamcinolone cream (KENALOG) 0.1 % Apply topically.   Yes Historical Provider, MD    Family History  Family History  Problem Relation Age of Onset  . Anemia Neg Hx   . Arrhythmia Neg Hx   . Asthma Neg Hx   . Clotting disorder Neg Hx   .  Fainting Neg Hx   . Heart attack Neg Hx   . Heart disease Neg Hx   . Heart failure Neg Hx   . Hyperlipidemia Neg Hx   . Hypertension Neg Hx     Social History  History   Social History  . Marital Status: Married    Spouse Name: N/A    Number of Children: 1  . Years of Education: N/A   Occupational History  .     Social History Main Topics  . Smoking status: Former Research scientist (life sciences)  . Smokeless tobacco: Never Used     Comment: quit May 2015 - light smoker in the past for 12 years  . Alcohol Use: No  . Drug Use: No  . Sexual Activity: Not on file   Other Topics Concern   . 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  Cardiovascular:  No chest pain, dyspnea on exertion, edema, orthopnea, palpitations, paroxysmal nocturnal dyspnea. Dermatological: No rash, lesions/masses Respiratory: No cough, dyspnea Urologic: No hematuria, dysuria Abdominal:   No nausea, vomiting, diarrhea, bright red blood per rectum, melena, or hematemesis Neurologic:  No visual changes, wkns, changes in mental status. All other systems reviewed and are otherwise negative except as noted above.  Physical Exam  Blood pressure 110/66, pulse 68, height 5\' 7"  (1.702 m), weight 136 lb (61.689 kg), SpO2 98 %.  General: Pleasant, NAD Psych: Normal affect. Neuro: Alert and oriented X 3. Moves all extremities spontaneously. HEENT: Normal  Neck: Supple without bruits or JVD. Lungs:  Resp regular and unlabored, CTA. Heart: RRR no s3, s4, 2/6 systolic murmur. Abdomen: Soft, non-tender, non-distended, BS + x 4.  Extremities: No clubbing, cyanosis or edema. DP/PT/Radials 2+ and equal bilaterally.  Labs:  No results for input(s): CKTOTAL, CKMB, TROPONINI in the last 72 hours. Lab Results  Component Value Date   WBC 8.6 01/15/2014   HGB 15.2 01/15/2014   HCT 46.4 01/15/2014   MCV 92.1 01/15/2014   PLT 197 01/15/2014    No results found for: DDIMER Invalid input(s): POCBNP    Component Value Date/Time   NA 136* 01/15/2014 2105   K 3.8 01/15/2014 2105   CL 95* 01/15/2014 2105   CO2 27 01/15/2014 2105   GLUCOSE 116* 01/15/2014 2105   BUN 10 01/15/2014 2105   CREATININE 1.13 01/15/2014 2105   CALCIUM 8.9 01/15/2014 2105   PROT 7.2 01/15/2014 2105   ALBUMIN 3.8 01/15/2014 2105     AST 52* 01/15/2014 2105   ALT 61* 01/15/2014 2105   ALKPHOS 57 01/15/2014 2105   BILITOT 0.2* 01/15/2014 2105   GFRNONAA 82* 01/15/2014 2105   GFRAA >90 01/15/2014 2105   Lab Results  Component Value Date   CHOL 197 06/09/2014    Accessory Clinical Findings  ECG - SR, possible bi-atrial enlargement  GXT: 07/30/2014 ST Segment Analysis At Rest:NSR with diffuse ST elevation suggestive of early repolarization abnormality. With Exercise:no evidence of significant ST depression  Other Information Arrhythmia: NoAngina during ETT: absent (0) Quality of ETT: diagnostic  ETT Interpretation: abnormal - no evidence  of ischemia by ST analysis; hypotensive with peak exercise.  Comments: ETT with good exercise tolerance (11:00); baseline ECG with diffuse ST elevation most likely related to early repolarization abnormality; no chest pain; no arrhythmias; hypotensive response (SBP 123-stage 3; 91-stage 4; 132 one minute into recovery); no ST changes. ETT felt to be abnormal due to documented decrease BP from stage 3 to stage 4.   Coronary CTA 08/12/2014 IMPRESSION: 1. Coronary calcium score of 0 this was 0 percentile for age and sex matched control.  2. Normal origin of the coronary arteries. Right dominance. No evidence of CAD. No bridge.  Ena Dawley  Electronically Signed  By: Ena Dawley  On: 08/13/2014 10:42     Assessment & Plan  38 year old male with   1. 3 prior syncopal episodes that happened at the time of exhaustion or exercise induced tachycardia.  One episode happen while he was motion sick. His echocardiogram is normal, baseline ECG is not suggestive of WPW syndrome or Brugada. NO FH of SCD. No syncope in the last 3 years. Echocardiogram at [facility] completely normal.   Exercise treadmill stress test was abnormal - it showed baseline diffuse ST elevation most likely related to early repolarization. Patient  developed hypotensive response at stage III of exercise and test was terminated. He felt profoundly fatigued and test was considered abnormal secondary to exertional hypertension.  We performed coronary CT to rule out anomalous coronary origin. His origin is normal he has calcium score of 0 and no atherosclerotic plaque at all.    The patient is advised to hydrate well  Prior to the exercise , also to use electrolyte drinks such as Gatorade prior to exercise or electrolyte pill such as Nuun.  He is also advised to use compression socks while running. He is further encouraged to continue exercising but rather didn't do longer-lasting runs or other type of exercise he is encouraged to do more frequent shorter distances.  2. Hyperlipidemia -  Considering his calcium score is 0 , and his no other risk factors, he doesn't need any statin at this point.  Follow up  as needed.  Dorothy Spark, MD, [facility] 08/24/2014, 11:03 AM

## 2014-08-24 NOTE — Patient Instructions (Signed)
Your physician recommends that you schedule a follow-up appointment WHEN NEEDED.

## 2015-03-02 ENCOUNTER — Encounter: Payer: Self-pay | Admitting: Family Medicine

## 2016-08-18 ENCOUNTER — Ambulatory Visit (INDEPENDENT_AMBULATORY_CARE_PROVIDER_SITE_OTHER): Payer: BLUE CROSS/BLUE SHIELD

## 2016-08-18 DIAGNOSIS — Z23 Encounter for immunization: Secondary | ICD-10-CM

## 2016-09-22 ENCOUNTER — Ambulatory Visit (INDEPENDENT_AMBULATORY_CARE_PROVIDER_SITE_OTHER): Payer: BLUE CROSS/BLUE SHIELD | Admitting: Family Medicine

## 2016-09-22 ENCOUNTER — Encounter: Payer: Self-pay | Admitting: Family Medicine

## 2016-09-22 VITALS — BP 82/60 | HR 67 | Temp 98.2°F | Ht 66.75 in | Wt 137.9 lb

## 2016-09-22 DIAGNOSIS — Z0000 Encounter for general adult medical examination without abnormal findings: Secondary | ICD-10-CM

## 2016-09-22 DIAGNOSIS — Z23 Encounter for immunization: Secondary | ICD-10-CM

## 2016-09-22 DIAGNOSIS — R899 Unspecified abnormal finding in specimens from other organs, systems and tissues: Secondary | ICD-10-CM

## 2016-09-22 DIAGNOSIS — E785 Hyperlipidemia, unspecified: Secondary | ICD-10-CM

## 2016-09-22 LAB — CBC
HEMATOCRIT: 45.1 % (ref 39.0–52.0)
HEMOGLOBIN: 14.9 g/dL (ref 13.0–17.0)
MCHC: 33.1 g/dL (ref 30.0–36.0)
MCV: 90.1 fl (ref 78.0–100.0)
Platelets: 263 10*3/uL (ref 150.0–400.0)
RBC: 5.01 Mil/uL (ref 4.22–5.81)
RDW: 12.7 % (ref 11.5–15.5)
WBC: 10.9 10*3/uL — AB (ref 4.0–10.5)

## 2016-09-22 LAB — LIPID PANEL
CHOL/HDL RATIO: 4
Cholesterol: 189 mg/dL (ref 0–200)
HDL: 46.6 mg/dL (ref >39.00)
LDL Cholesterol: 128 mg/dL — ABNORMAL HIGH (ref 0–99)
NONHDL: 142.86
Triglycerides: 74 mg/dL (ref 0.0–149.0)
VLDL: 14.8 mg/dL (ref 0.0–40.0)

## 2016-09-22 LAB — BASIC METABOLIC PANEL
BUN: 17 mg/dL (ref 6–23)
CHLORIDE: 103 meq/L (ref 96–112)
CO2: 35 mEq/L — ABNORMAL HIGH (ref 19–32)
Calcium: 9.4 mg/dL (ref 8.4–10.5)
Creatinine, Ser: 1.02 mg/dL (ref 0.40–1.50)
GFR: 86.21 mL/min (ref >60.00)
Glucose, Bld: 89 mg/dL (ref 70–99)
Potassium: 4.3 mEq/L (ref 3.5–5.1)
Sodium: 140 mEq/L (ref 135–145)

## 2016-09-22 LAB — HEMOGLOBIN A1C: HEMOGLOBIN A1C: 5.5 % (ref 4.6–6.5)

## 2016-09-22 NOTE — Patient Instructions (Addendum)
BEFORE YOU LEAVE: -Tdap vaccine -labs -follow up: yearly for physical and as needed  We have ordered labs or studies at this visit. It can take up to 1-2 weeks for results and processing. IF results require follow up or explanation, we will call you with instructions. Clinically stable results will be released to your [facility]. If you have not heard from [place] or cannot find your results in [facility] in 2 weeks please contact our office at [phone].    We recommend the following healthy lifestyle for LIFE: 1) Small portions.   Tip: eat off of a salad plate instead of a dinner plate.  Tip: if you need more or a snack choose fruits, veggies and/or a handful of nuts or seeds.  2) Eat a healthy clean diet.  * Tip: Avoid (less then 1 serving per week): processed foods, sweets, sweetened drinks, white starches (rice, flour, bread, potatoes, pasta, etc), red meat, fast foods, butter  *Tip: CHOOSE instead   * 5-9 servings per day of fresh or frozen fruits and vegetables (but not corn, potatoes, bananas, canned or dried fruit)   *nuts and seeds, beans   *olives and olive oil   *small portions of lean meats such as fish and white chicken    *small portions of whole grains  3)Get at least 150 minutes of sweaty aerobic exercise per week.  4)Reduce stress - consider counseling, meditation and relaxation to balance other aspects of your life.

## 2016-09-22 NOTE — Progress Notes (Signed)
Pre visit review using our clinic review tool, if applicable. No additional management support is needed unless otherwise documented below in the visit note. 

## 2016-09-22 NOTE — Addendum Note (Signed)
Addended by: Beckie Busing on: 09/22/2016 10:45 AM   Modules accepted: Orders

## 2016-09-22 NOTE — Progress Notes (Signed)
HPI:  Here for CPE:  -Concerns and/or follow up today: none PMH thymoma s/p resection in 2015, followed by Dr. Lianne Moris at [facility] cardiothoracic [place]. They advised no recurrence and no need for further surveillance.  -Diet: variety of foods, balance and well rounded  -Exercise:  regular exercise  -Diabetes and Dyslipidemia Screening:fasting for labs  -Hx of HTN: no  -Vaccines: UTD except he wishes to get Tdap, young baby in home  -sexual activity: yes, male partner, no new partners  -wants STI testing, Hep C screening (if born 61-1964): no  -FH colon or prstate ca: see FH Last colon cancer screening: n/a Last prostate ca screening: n/a  -Alcohol, Tobacco, drug use: see social history  Review of Systems - no fevers, unintentional weight loss, vision loss, hearing loss, chest pain, sob, hemoptysis, melena, hematochezia, hematuria, genital discharge, changing or concerning skin lesions, bleeding, bruising, loc, thoughts of self harm or SI  Past Medical History:  Diagnosis Date  . Impetigo    treated with Bactrim resulting in systemic drug rash  . Syncope    in 2013 per pt evaluated in ED and ok  . 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 Lianne Moris at [facility] survailence q 29mos at [facility] [place] completed in 2017  . Tobacco use     Past Surgical History:  Procedure Laterality Date  . ABDOMINAL SURGERY    . ROBATIC THYMECTOMY  03/09/14  . TUBE THORACOTOMY  01/27/14    Family History  Problem Relation Age of Onset  . Anemia Neg Hx   . Arrhythmia Neg Hx   . Asthma Neg Hx   . Clotting disorder Neg Hx   . Fainting Neg Hx   . Heart attack Neg Hx   . Heart disease Neg Hx   . Heart failure Neg Hx   . Hyperlipidemia Neg Hx   . Hypertension Neg Hx     Social History   Social History  . Marital status: Married    Spouse name: N/A  . Number of children: 1  . Years of education: N/A   Occupational History  .  [employer]   Social  History Main Topics  . Smoking status: Former Research scientist (life sciences)  . Smokeless tobacco: Never Used     Comment: quit May 2015 - light smoker in the past for 12 years  . Alcohol use No  . Drug use: No  . Sexual activity: Not Asked   Other Topics Concern  . 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  clobetasol cream (TEMOVATE) 0.05 %, as needed., Disp: , Rfl:  .  triamcinolone cream (KENALOG) 0.1 %, Apply topically., Disp: , Rfl:   EXAM:  Vitals:   09/22/16 0928  BP: (!) 82/60  Pulse: 67  Temp: 98.2 F (36.8 C)  Weight: 137 lb 14.4 oz (62.6 kg)  Height: 5' 6.75" (1.695 m)    Estimated body mass index is 21.76 kg/m as calculated from the following:   Height as of this encounter: 5' 6.75" (1.695 m).   Weight as of this encounter: 137 lb 14.4 oz (62.6 kg).  GENERAL: vitals reviewed  and listed below, alert, oriented, appears well hydrated and in no acute distress  HEENT: head atraumatic, PERRLA, normal appearance of eyes, ears, nose and mouth. moist mucus membranes.  NECK: supple, no masses or lymphadenopathy  LUNGS: clear to auscultation bilaterally, no rales, rhonchi or wheeze  CV: HRRR, no peripheral edema or cyanosis, normal pedal pulses  ABDOMEN: bowel sounds normal, soft, non tender to palpation, no masses, no rebound or guarding  GU: deferred  RECTAL: refused  SKIN: no rash or abnormal lesions  MS: normal gait, moves all extremities normally  NEURO: normal gait, speech and thought processing grossly intact, muscle tone grossly intact throughout  PSYCH: normal affect, pleasant and cooperative  ASSESSMENT AND PLAN:  Discussed the following assessment and plan:  Encounter for preventive health examination - Plan: CBC (no diff), Lipid Panel,  Hemoglobin [id number], Basic metabolic panel  -Discussed and advised all [place] preventive services health task force level A and B recommendations for age, sex and risks.  --Advised at least 150 minutes of exercise per week and a healthy diet with avoidance of (less then 1 serving per week) processed foods, white starches, red meat, fast foods and sweets and consisting of: * 5-9 servings of fresh fruits and vegetables (not corn or potatoes) *nuts and seeds, beans *olives and olive oil *lean meats such as fish and white chicken  *whole grains  -FASTING labs, studies and vaccines per orders this encounter   Patient advised to return to clinic immediately if symptoms worsen or persist or new concerns.  Patient Instructions  BEFORE YOU LEAVE: -Tdap vaccine -labs -follow up: yearly for physical and as needed  We have ordered labs or studies at this visit. It can take up to 1-2 weeks for results and processing. IF results require follow up or explanation, we will call you with instructions. Clinically stable results will be released to your [facility]. If you have not heard from [place] or cannot find your results in [facility] in 2 weeks please contact our office at [phone].    We recommend the following healthy lifestyle for LIFE: 1) Small portions.   Tip: eat off of a salad plate instead of a dinner plate.  Tip: if you need more or a snack choose fruits, veggies and/or a handful of nuts or seeds.  2) Eat a healthy clean diet.  * Tip: Avoid (less then 1 serving per week): processed foods, sweets, sweetened drinks, white starches (rice, flour, bread, potatoes, pasta, etc), red meat, fast foods, butter  *Tip: CHOOSE instead   * 5-9 servings per day of fresh or frozen fruits and vegetables (but not corn, potatoes, bananas, canned or dried fruit)   *nuts and seeds, beans   *olives and olive oil   *small portions of lean meats such as fish and white chicken    *small portions of whole grains  3)Get at  least 150 minutes of sweaty aerobic exercise per week.  4)Reduce stress - consider counseling, meditation and relaxation to balance other aspects of your life.           No Follow-up on file.   Colin Benton R., DO

## 2016-10-03 ENCOUNTER — Encounter: Payer: Self-pay | Admitting: Family Medicine

## 2016-10-05 NOTE — Addendum Note (Signed)
Addended by: Agnes Lawrence on: 10/05/2016 05:45 PM   Modules accepted: Orders

## 2017-01-05 ENCOUNTER — Other Ambulatory Visit: Payer: BLUE CROSS/BLUE SHIELD

## 2017-05-31 ENCOUNTER — Encounter: Payer: Self-pay | Admitting: Family Medicine

## 2017-09-20 ENCOUNTER — Encounter: Payer: Self-pay | Admitting: Family Medicine

## 2018-08-23 DIAGNOSIS — Z0000 Encounter for general adult medical examination without abnormal findings: Secondary | ICD-10-CM | POA: Diagnosis not present

## 2018-09-02 DIAGNOSIS — Z0000 Encounter for general adult medical examination without abnormal findings: Secondary | ICD-10-CM | POA: Diagnosis not present

## 2018-09-10 DIAGNOSIS — Z23 Encounter for immunization: Secondary | ICD-10-CM | POA: Diagnosis not present

## 2019-07-15 DIAGNOSIS — Z23 Encounter for immunization: Secondary | ICD-10-CM | POA: Diagnosis not present

## 2019-08-05 DIAGNOSIS — Z20828 Contact with and (suspected) exposure to other viral communicable diseases: Secondary | ICD-10-CM | POA: Diagnosis not present
